# Patient Record
Sex: Male | Born: 1985 | Hispanic: No | Marital: Single | Smoking: Former smoker
Health system: Southern US, Community
[De-identification: ages and names within clinical notes are randomized; demographics above are authoritative.]

## PROBLEM LIST (undated history)

## (undated) DIAGNOSIS — B1920 Unspecified viral hepatitis C without hepatic coma: Secondary | ICD-10-CM

---

## 2009-08-23 DIAGNOSIS — B1920 Unspecified viral hepatitis C without hepatic coma: Secondary | ICD-10-CM

## 2009-08-23 HISTORY — DX: Unspecified viral hepatitis C without hepatic coma: B19.20

## 2014-10-03 ENCOUNTER — Encounter (HOSPITAL_COMMUNITY): Admission: EM | Payer: Self-pay | Source: Home / Self Care

## 2014-10-03 ENCOUNTER — Inpatient Hospital Stay (HOSPITAL_COMMUNITY)
Admission: EM | Admit: 2014-10-03 | Discharge: 2014-10-10 | DRG: 327 | Disposition: A | Discharge status: Home / Self Care | Payer: Medicaid Other | Attending: General Surgery | Admitting: General Surgery

## 2014-10-03 ENCOUNTER — Emergency Department (HOSPITAL_COMMUNITY): Payer: Medicaid Other | Admitting: Certified Registered"

## 2014-10-03 ENCOUNTER — Emergency Department (HOSPITAL_COMMUNITY): Payer: Medicaid Other

## 2014-10-03 ENCOUNTER — Encounter (HOSPITAL_COMMUNITY): Payer: Self-pay | Admitting: Radiology

## 2014-10-03 DIAGNOSIS — S3510XA Unspecified injury of inferior vena cava, initial encounter: Secondary | ICD-10-CM

## 2014-10-03 DIAGNOSIS — K660 Peritoneal adhesions (postprocedural) (postinfection): Secondary | ICD-10-CM | POA: Diagnosis present

## 2014-10-03 DIAGNOSIS — S37001A Unspecified injury of right kidney, initial encounter: Secondary | ICD-10-CM

## 2014-10-03 DIAGNOSIS — Z9889 Other specified postprocedural states: Secondary | ICD-10-CM

## 2014-10-03 DIAGNOSIS — S36533A Laceration of sigmoid colon, initial encounter: Secondary | ICD-10-CM | POA: Diagnosis present

## 2014-10-03 DIAGNOSIS — D62 Acute posthemorrhagic anemia: Secondary | ICD-10-CM | POA: Diagnosis not present

## 2014-10-03 DIAGNOSIS — X788XXA Intentional self-harm by other sharp object, initial encounter: Secondary | ICD-10-CM | POA: Diagnosis present

## 2014-10-03 DIAGNOSIS — T191XXA Foreign body in bladder, initial encounter: Secondary | ICD-10-CM | POA: Diagnosis present

## 2014-10-03 DIAGNOSIS — S31119A Laceration without foreign body of abdominal wall, unspecified quadrant without penetration into peritoneal cavity, initial encounter: Secondary | ICD-10-CM

## 2014-10-03 DIAGNOSIS — T189XXA Foreign body of alimentary tract, part unspecified, initial encounter: Secondary | ICD-10-CM

## 2014-10-03 DIAGNOSIS — T183XXA Foreign body in small intestine, initial encounter: Principal | ICD-10-CM | POA: Diagnosis present

## 2014-10-03 DIAGNOSIS — Y92149 Unspecified place in prison as the place of occurrence of the external cause: Secondary | ICD-10-CM

## 2014-10-03 DIAGNOSIS — S3630XA Unspecified injury of stomach, initial encounter: Secondary | ICD-10-CM | POA: Diagnosis present

## 2014-10-03 DIAGNOSIS — S37009A Unspecified injury of unspecified kidney, initial encounter: Secondary | ICD-10-CM | POA: Diagnosis present

## 2014-10-03 DIAGNOSIS — T1490XA Injury, unspecified, initial encounter: Secondary | ICD-10-CM

## 2014-10-03 DIAGNOSIS — S31129A Laceration of abdominal wall with foreign body, unspecified quadrant without penetration into peritoneal cavity, initial encounter: Secondary | ICD-10-CM | POA: Diagnosis present

## 2014-10-03 DIAGNOSIS — T184XXA Foreign body in colon, initial encounter: Secondary | ICD-10-CM | POA: Diagnosis present

## 2014-10-03 DIAGNOSIS — S36503A Unspecified injury of sigmoid colon, initial encounter: Secondary | ICD-10-CM

## 2014-10-03 DIAGNOSIS — K631 Perforation of intestine (nontraumatic): Secondary | ICD-10-CM

## 2014-10-03 DIAGNOSIS — T182XXA Foreign body in stomach, initial encounter: Secondary | ICD-10-CM | POA: Diagnosis present

## 2014-10-03 DIAGNOSIS — S3720XA Unspecified injury of bladder, initial encounter: Secondary | ICD-10-CM | POA: Diagnosis present

## 2014-10-03 DIAGNOSIS — S36498A Other injury of other part of small intestine, initial encounter: Secondary | ICD-10-CM | POA: Diagnosis present

## 2014-10-03 HISTORY — DX: Unspecified viral hepatitis C without hepatic coma: B19.20

## 2014-10-03 HISTORY — PX: LAPAROTOMY: SHX154

## 2014-10-03 LAB — COMPREHENSIVE METABOLIC PANEL
ALT: 43 U/L (ref 0–53)
AST: 29 U/L (ref 0–37)
Albumin: 3.7 g/dL (ref 3.5–5.2)
Alkaline Phosphatase: 103 U/L (ref 39–117)
Anion gap: 8 (ref 5–15)
BUN: 10 mg/dL (ref 6–23)
CALCIUM: 9.3 mg/dL (ref 8.4–10.5)
CO2: 28 mmol/L (ref 19–32)
CREATININE: 0.74 mg/dL (ref 0.50–1.35)
Chloride: 99 mmol/L (ref 96–112)
GFR calc Af Amer: >90 mL/min (ref >90)
GFR calc non Af Amer: >90 mL/min (ref >90)
GLUCOSE: 110 mg/dL — AB (ref 70–99)
Potassium: 4.3 mmol/L (ref 3.5–5.1)
Sodium: 135 mmol/L (ref 135–145)
Total Bilirubin: 0.4 mg/dL (ref 0.3–1.2)
Total Protein: 7.9 g/dL (ref 6.0–8.3)

## 2014-10-03 LAB — URINE MICROSCOPIC-ADD ON

## 2014-10-03 LAB — I-STAT CG4 LACTIC ACID, ED: LACTIC ACID, VENOUS: 2.08 mmol/L — AB (ref 0.5–2.0)

## 2014-10-03 LAB — URINALYSIS, ROUTINE W REFLEX MICROSCOPIC
Bilirubin Urine: NEGATIVE
Glucose, UA: NEGATIVE mg/dL
Hgb urine dipstick: NEGATIVE
Ketones, ur: NEGATIVE mg/dL
Nitrite: NEGATIVE
PH: 6.5 (ref 5.0–8.0)
Protein, ur: NEGATIVE mg/dL
SPECIFIC GRAVITY, URINE: 1.028 (ref 1.005–1.030)
Urobilinogen, UA: 0.2 mg/dL (ref 0.0–1.0)

## 2014-10-03 LAB — I-STAT CHEM 8, ED
BUN: 12 mg/dL (ref 6–23)
CALCIUM ION: 1.15 mmol/L (ref 1.12–1.23)
CHLORIDE: 100 mmol/L (ref 96–112)
Creatinine, Ser: 0.7 mg/dL (ref 0.50–1.35)
Glucose, Bld: 105 mg/dL — ABNORMAL HIGH (ref 70–99)
HCT: 49 % (ref 39.0–52.0)
HEMOGLOBIN: 16.7 g/dL (ref 13.0–17.0)
Potassium: 4.4 mmol/L (ref 3.5–5.1)
Sodium: 140 mmol/L (ref 135–145)
TCO2: 24 mmol/L (ref 0–100)

## 2014-10-03 LAB — CBC
HCT: 40.4 % (ref 39.0–52.0)
Hemoglobin: 13.8 g/dL (ref 13.0–17.0)
MCH: 28.8 pg (ref 26.0–34.0)
MCHC: 34.2 g/dL (ref 30.0–36.0)
MCV: 84.2 fL (ref 78.0–100.0)
Platelets: 277 10*3/uL (ref 150–400)
RBC: 4.8 MIL/uL (ref 4.22–5.81)
RDW: 12.6 % (ref 11.5–15.5)
WBC: 8.4 10*3/uL (ref 4.0–10.5)

## 2014-10-03 LAB — PROTIME-INR
INR: 0.95 (ref 0.00–1.49)
PROTHROMBIN TIME: 12.8 s (ref 11.6–15.2)

## 2014-10-03 LAB — ABO/RH: ABO/RH(D): A POS

## 2014-10-03 LAB — CDS SEROLOGY

## 2014-10-03 LAB — PREPARE RBC (CROSSMATCH)

## 2014-10-03 LAB — ETHANOL: Alcohol, Ethyl (B): <5 mg/dL (ref 0–9)

## 2014-10-03 LAB — I-STAT TROPONIN, ED: Troponin i, poc: 0 ng/mL (ref 0.00–0.08)

## 2014-10-03 SURGERY — LAPAROTOMY, EXPLORATORY
Anesthesia: General | Site: Abdomen

## 2014-10-03 MED ORDER — ROCURONIUM BROMIDE 50 MG/5ML IV SOLN
INTRAVENOUS | Status: AC
  Filled 2014-10-03: qty 1

## 2014-10-03 MED ORDER — NEOSTIGMINE METHYLSULFATE 10 MG/10ML IV SOLN
INTRAVENOUS | Status: AC
  Filled 2014-10-03: qty 1

## 2014-10-03 MED ORDER — ROCURONIUM BROMIDE 100 MG/10ML IV SOLN
INTRAVENOUS | Status: DC | PRN
  Administered 2014-10-03 (×2): 30 mg via INTRAVENOUS
  Administered 2014-10-03 (×2): 20 mg via INTRAVENOUS

## 2014-10-03 MED ORDER — HEMOSTATIC AGENTS (NO CHARGE) OPTIME
TOPICAL | Status: DC | PRN
  Administered 2014-10-03: 1 via TOPICAL

## 2014-10-03 MED ORDER — PROPOFOL 10 MG/ML IV BOLUS
INTRAVENOUS | Status: DC | PRN
Start: 2014-10-03 — End: 2014-10-04
  Administered 2014-10-03: 130 mg via INTRAVENOUS
  Administered 2014-10-03: 70 mg via INTRAVENOUS

## 2014-10-03 MED ORDER — HYDROMORPHONE HCL 1 MG/ML IJ SOLN
INTRAMUSCULAR | Status: AC
  Filled 2014-10-03: qty 1

## 2014-10-03 MED ORDER — MIDAZOLAM HCL 2 MG/2ML IJ SOLN
INTRAMUSCULAR | Status: AC
  Filled 2014-10-03: qty 2

## 2014-10-03 MED ORDER — MIDAZOLAM HCL 5 MG/5ML IJ SOLN
INTRAMUSCULAR | Status: DC | PRN
  Administered 2014-10-03: 2 mg via INTRAVENOUS

## 2014-10-03 MED ORDER — CEFAZOLIN SODIUM-DEXTROSE 2-3 GM-% IV SOLR
INTRAVENOUS | Status: DC | PRN
  Administered 2014-10-03: 2 g via INTRAVENOUS

## 2014-10-03 MED ORDER — SUCCINYLCHOLINE CHLORIDE 20 MG/ML IJ SOLN
INTRAMUSCULAR | Status: DC | PRN
  Administered 2014-10-03: 100 mg via INTRAVENOUS

## 2014-10-03 MED ORDER — HYDROMORPHONE HCL 1 MG/ML IJ SOLN
0.2500 mg | INTRAMUSCULAR | Status: DC | PRN
  Administered 2014-10-04 (×2): 0.5 mg via INTRAVENOUS

## 2014-10-03 MED ORDER — SUFENTANIL CITRATE 50 MCG/ML IV SOLN
INTRAVENOUS | Status: DC | PRN
  Administered 2014-10-03 (×2): 20 ug via INTRAVENOUS
  Administered 2014-10-03: 10 ug via INTRAVENOUS

## 2014-10-03 MED ORDER — SUFENTANIL CITRATE 50 MCG/ML IV SOLN
INTRAVENOUS | Status: AC
  Filled 2014-10-03: qty 1

## 2014-10-03 MED ORDER — CEFAZOLIN SODIUM-DEXTROSE 2-3 GM-% IV SOLR
INTRAVENOUS | Status: AC
  Filled 2014-10-03: qty 50

## 2014-10-03 MED ORDER — IOHEXOL 300 MG/ML  SOLN
100.0000 mL | Freq: Once | INTRAMUSCULAR | Status: AC | PRN
  Administered 2014-10-03: 100 mL via INTRAVENOUS

## 2014-10-03 MED ORDER — 0.9 % SODIUM CHLORIDE (POUR BTL) OPTIME
TOPICAL | Status: DC | PRN
  Administered 2014-10-03: 2000 mL

## 2014-10-03 MED ORDER — NEOSTIGMINE METHYLSULFATE 10 MG/10ML IV SOLN
INTRAVENOUS | Status: DC | PRN
  Administered 2014-10-03: 3 mg via INTRAVENOUS

## 2014-10-03 MED ORDER — SODIUM CHLORIDE 0.9 % IJ SOLN
INTRAMUSCULAR | Status: AC
  Filled 2014-10-03: qty 10

## 2014-10-03 MED ORDER — DEXAMETHASONE SODIUM PHOSPHATE 4 MG/ML IJ SOLN
INTRAMUSCULAR | Status: AC
  Filled 2014-10-03: qty 1

## 2014-10-03 MED ORDER — DEXAMETHASONE SODIUM PHOSPHATE 4 MG/ML IJ SOLN
INTRAMUSCULAR | Status: DC | PRN
  Administered 2014-10-03: 4 mg via INTRAVENOUS

## 2014-10-03 MED ORDER — EPHEDRINE SULFATE 50 MG/ML IJ SOLN
INTRAMUSCULAR | Status: AC
  Filled 2014-10-03: qty 1

## 2014-10-03 MED ORDER — LIDOCAINE HCL (CARDIAC) 20 MG/ML IV SOLN
INTRAVENOUS | Status: DC | PRN
  Administered 2014-10-03: 100 mg via INTRAVENOUS

## 2014-10-03 MED ORDER — ONDANSETRON HCL 4 MG/2ML IJ SOLN
INTRAMUSCULAR | Status: DC | PRN
  Administered 2014-10-03: 4 mg via INTRAVENOUS

## 2014-10-03 MED ORDER — LIDOCAINE HCL (CARDIAC) 20 MG/ML IV SOLN
INTRAVENOUS | Status: AC
  Filled 2014-10-03: qty 5

## 2014-10-03 MED ORDER — LACTATED RINGERS IV SOLN
INTRAVENOUS | Status: DC | PRN
  Administered 2014-10-03 (×3): via INTRAVENOUS

## 2014-10-03 MED ORDER — ONDANSETRON HCL 4 MG/2ML IJ SOLN
INTRAMUSCULAR | Status: AC
  Filled 2014-10-03: qty 2

## 2014-10-03 MED ORDER — GLYCOPYRROLATE 0.2 MG/ML IJ SOLN
INTRAMUSCULAR | Status: AC
  Filled 2014-10-03: qty 2

## 2014-10-03 MED ORDER — EPHEDRINE SULFATE 50 MG/ML IJ SOLN
INTRAMUSCULAR | Status: DC | PRN
  Administered 2014-10-03 (×2): 10 mg via INTRAVENOUS

## 2014-10-03 MED ORDER — GLYCOPYRROLATE 0.2 MG/ML IJ SOLN
INTRAMUSCULAR | Status: DC | PRN
  Administered 2014-10-03: 0.4 mg via INTRAVENOUS

## 2014-10-03 MED ORDER — SUCCINYLCHOLINE CHLORIDE 20 MG/ML IJ SOLN
INTRAMUSCULAR | Status: AC
  Filled 2014-10-03: qty 1

## 2014-10-03 SURGICAL SUPPLY — 71 items
BLADE SURG ROTATE 9660 (MISCELLANEOUS) IMPLANT
BNDG GAUZE ELAST 4 BULKY (GAUZE/BANDAGES/DRESSINGS) ×3 IMPLANT
CANISTER SUCTION 2500CC (MISCELLANEOUS) ×3 IMPLANT
CHLORAPREP W/TINT 26ML (MISCELLANEOUS) ×3 IMPLANT
CLIP TI MEDIUM 6 (CLIP) ×3 IMPLANT
COVER MAYO STAND STRL (DRAPES) ×6 IMPLANT
COVER SURGICAL LIGHT HANDLE (MISCELLANEOUS) ×3 IMPLANT
DRAIN CHANNEL 19F RND (DRAIN) ×6 IMPLANT
DRAPE C-ARM 42X72 X-RAY (DRAPES) ×3 IMPLANT
DRAPE LAPAROSCOPIC ABDOMINAL (DRAPES) ×3 IMPLANT
DRAPE PROXIMA HALF (DRAPES) IMPLANT
DRAPE UTILITY XL STRL (DRAPES) ×6 IMPLANT
DRAPE WARM FLUID 44X44 (DRAPE) ×3 IMPLANT
DRSG OPSITE POSTOP 4X10 (GAUZE/BANDAGES/DRESSINGS) IMPLANT
DRSG OPSITE POSTOP 4X8 (GAUZE/BANDAGES/DRESSINGS) IMPLANT
DRSG PAD ABDOMINAL 8X10 ST (GAUZE/BANDAGES/DRESSINGS) ×6 IMPLANT
ELECT BLADE 6.5 EXT (BLADE) ×3 IMPLANT
ELECT CAUTERY BLADE 6.4 (BLADE) ×3 IMPLANT
ELECT REM PT RETURN 9FT ADLT (ELECTROSURGICAL) ×3
ELECTRODE REM PT RTRN 9FT ADLT (ELECTROSURGICAL) ×1 IMPLANT
EVACUATOR SILICONE 100CC (DRAIN) ×6 IMPLANT
GAUZE SPONGE 4X4 12PLY STRL (GAUZE/BANDAGES/DRESSINGS) ×3 IMPLANT
GAUZE SPONGE 4X4 16PLY XRAY LF (GAUZE/BANDAGES/DRESSINGS) ×3 IMPLANT
GLOVE BIO SURGEON STRL SZ7 (GLOVE) ×3 IMPLANT
GLOVE BIOGEL M STRL SZ7.5 (GLOVE) ×3 IMPLANT
GLOVE BIOGEL PI IND STRL 6.5 (GLOVE) ×1 IMPLANT
GLOVE BIOGEL PI IND STRL 7.5 (GLOVE) ×2 IMPLANT
GLOVE BIOGEL PI IND STRL 8 (GLOVE) ×1 IMPLANT
GLOVE BIOGEL PI INDICATOR 6.5 (GLOVE) ×2
GLOVE BIOGEL PI INDICATOR 7.5 (GLOVE) ×4
GLOVE BIOGEL PI INDICATOR 8 (GLOVE) ×2
GLOVE ECLIPSE 6.5 STRL STRAW (GLOVE) ×3 IMPLANT
GLOVE SURG SS PI 7.5 STRL IVOR (GLOVE) ×3 IMPLANT
GOWN STRL REUS W/ TWL LRG LVL3 (GOWN DISPOSABLE) ×3 IMPLANT
GOWN STRL REUS W/TWL LRG LVL3 (GOWN DISPOSABLE) ×6
HEMOSTAT SNOW SURGICEL 2X4 (HEMOSTASIS) ×3 IMPLANT
KIT BASIN OR (CUSTOM PROCEDURE TRAY) ×3 IMPLANT
KIT ROOM TURNOVER OR (KITS) ×3 IMPLANT
LIGASURE IMPACT 36 18CM CVD LR (INSTRUMENTS) ×3 IMPLANT
NS IRRIG 1000ML POUR BTL (IV SOLUTION) ×6 IMPLANT
PACK GENERAL/GYN (CUSTOM PROCEDURE TRAY) ×3 IMPLANT
PAD ARMBOARD 7.5X6 YLW CONV (MISCELLANEOUS) ×3 IMPLANT
PENCIL BUTTON HOLSTER BLD 10FT (ELECTRODE) IMPLANT
RELOAD PROXIMATE 75MM BLUE (ENDOMECHANICALS) ×9 IMPLANT
SPECIMEN JAR LARGE (MISCELLANEOUS) IMPLANT
SPONGE LAP 18X18 X RAY DECT (DISPOSABLE) ×6 IMPLANT
STAPLER GUN LINEAR PROX 60 (STAPLE) ×3 IMPLANT
STAPLER PROXIMATE 75MM BLUE (STAPLE) ×3 IMPLANT
STAPLER VISISTAT 35W (STAPLE) ×3 IMPLANT
SUCTION POOLE TIP (SUCTIONS) ×3 IMPLANT
SUT ETHILON 2 0 FS 18 (SUTURE) ×6 IMPLANT
SUT ETHILON 2 LR (SUTURE) ×15 IMPLANT
SUT NOVA NAB DX-16 0-1 5-0 T12 (SUTURE) ×12 IMPLANT
SUT PDS AB 1 TP1 96 (SUTURE) ×6 IMPLANT
SUT RET BRIDGE (SUTURE) ×6 IMPLANT
SUT SILK 2 0 (SUTURE) ×2
SUT SILK 2 0 SH CR/8 (SUTURE) ×12 IMPLANT
SUT SILK 2-0 18XBRD TIE 12 (SUTURE) ×1 IMPLANT
SUT SILK 3 0 (SUTURE) ×2
SUT SILK 3 0 SH CR/8 (SUTURE) ×6 IMPLANT
SUT SILK 3-0 18XBRD TIE 12 (SUTURE) ×1 IMPLANT
SUT VIC AB 2-0 SH 27 (SUTURE) ×2
SUT VIC AB 2-0 SH 27XBRD (SUTURE) ×1 IMPLANT
SUT VIC AB 3-0 SH 27 (SUTURE)
SUT VIC AB 3-0 SH 27X BRD (SUTURE) IMPLANT
TAPE CLOTH SURG 6X10 WHT LF (GAUZE/BANDAGES/DRESSINGS) ×3 IMPLANT
TOWEL OR 17X26 10 PK STRL BLUE (TOWEL DISPOSABLE) ×3 IMPLANT
TRAY FOLEY CATH 16FRSI W/METER (SET/KITS/TRAYS/PACK) IMPLANT
TUBE CONNECTING 12'X1/4 (SUCTIONS)
TUBE CONNECTING 12X1/4 (SUCTIONS) IMPLANT
YANKAUER SUCT BULB TIP NO VENT (SUCTIONS) IMPLANT

## 2014-10-03 NOTE — Anesthesia Preprocedure Evaluation (Addendum)
Anesthesia Evaluation  Patient identified by MRN, date of birth, ID band Patient awake    Reviewed: Allergy & Precautions, H&P , NPO status , Patient's Chart, lab work & pertinent test results  Airway Mallampati: I  TM Distance: >3 FB Neck ROM: Full    Dental no notable dental hx. (+) Teeth Intact, Dental Advisory Given   Pulmonary neg pulmonary ROS,  breath sounds clear to auscultation  Pulmonary exam normal       Cardiovascular negative cardio ROS  Rhythm:Regular Rate:Normal     Neuro/Psych Depression negative neurological ROS     GI/Hepatic negative GI ROS, Neg liver ROS,   Endo/Other  negative endocrine ROS  Renal/GU negative Renal ROS  negative genitourinary   Musculoskeletal   Abdominal   Peds  Hematology negative hematology ROS (+)   Anesthesia Other Findings   Reproductive/Obstetrics negative OB ROS                            Anesthesia Physical Anesthesia Plan  ASA: II and emergent  Anesthesia Plan: General   Post-op Pain Management:    Induction: Intravenous, Rapid sequence and Cricoid pressure planned  Airway Management Planned: Oral ETT  Additional Equipment:   Intra-op Plan:   Post-operative Plan: Extubation in OR  Informed Consent: I have reviewed the patients History and Physical, chart, labs and discussed the procedure including the risks, benefits and alternatives for the proposed anesthesia with the patient or authorized representative who has indicated his/her understanding and acceptance.   Dental advisory given  Plan Discussed with: CRNA  Anesthesia Plan Comments:         Anesthesia Quick Evaluation

## 2014-10-03 NOTE — ED Notes (Signed)
OR ready for patient

## 2014-10-03 NOTE — ED Notes (Signed)
No neuro or ortho consults made

## 2014-10-03 NOTE — ED Notes (Signed)
Pt returned to room from CT, guards remain at bedside, pt remains in handcuffs, alert and oriented

## 2014-10-03 NOTE — ED Notes (Signed)
Patient transported to CT scanner 2 with Dillard CannonEmily B, RN
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
## 2014-10-03 NOTE — Anesthesia Procedure Notes (Signed)
Procedure Name: Intubation Date/Time: 10/03/2014 8:50 PM Performed by: Melina SchoolsBANKS, Connor Weiss Pre-anesthesia Checklist: Patient identified, Emergency Drugs available, Suction available, Patient being monitored and Timeout performed Patient Re-evaluated:Patient Re-evaluated prior to inductionOxygen Delivery Method: Circle system utilized Preoxygenation: Pre-oxygenation with 100% oxygen Intubation Type: IV induction, Rapid sequence and Cricoid Pressure applied Laryngoscope Size: Mac and 4 Grade View: Grade I Tube type: Oral Tube size: 8.0 mm Number of attempts: 1 Airway Equipment and Method: Stylet Placement Confirmation: ETT inserted through vocal cords under direct vision,  positive ETCO2 and breath sounds checked- equal and bilateral Secured at: 23 cm Tube secured with: Tape Dental Injury: Teeth and Oropharynx as per pre-operative assessment

## 2014-10-03 NOTE — ED Notes (Addendum)
Patient told officers during [facility] that he swallowed a razor blade and had opened his abdominal wound from previous self inflicted laceration with paper clips.   Neither paper clips nor actual razor blades were observed by EMS or police.   Razor is reported to be approx 2 cm in width, 2 inches in length.  Lungs clear and equal bilaterally.  No blood noted in mouth, no bruiosing in neck.  Pain in neck is more severe then pain in abdomen.    EMS vs 140/96, 100 reg.  No IV,s

## 2014-10-03 NOTE — Op Note (Signed)
Preoperative diagnosis: Multiple ingested foreign bodies with multiple perforations Postoperative diagnosis: #1 razor blade and foreign body in the stomach without perforation #2 foreign body traversing the duodenum into the right kidney #3 foreign body traversing posterior to the vena cava into the psoas and paraspinous musculature #4 foreign body protruding from the distal ileum into the bladder #5 foreign body perforation of the distal sigmoid colon Procedure: #1 lyses of adhesions 45 minutes #2 removal of small bowel foreign body with small bowel resection and primary anastomosis #3 repair of bladder injury #4 removal of sigmoid colon foreign body and primary repair of perforation #5 removal of duodenal foreign body with primary repair #6 removal of foreign body in the paraspinous musculature #7 gastrotomy with removal of 2 intragastric foreign bodies #8 incidental appendectomy Surgeon: Dr. Harden MoMatt Raegan Weiss Asst.: Dr. Gaynelle AduEric Wilson Estimated blood loss: 75 mL Drains: #1 19 French Blake drain placed near the duodenal perforation as well as where the foreign body under the right kidney #2 19 [place] Blake drain placed near the bladder repair Complications: None Specimens: #1 small bowel #2 multiple foreign bodies Special count was correct at completion Disposition to stepdown in stable condition  Indications: This a 29 year old male who was very difficult to get a history from. Apparently he has ingested multiple foreign bodies and has had at least had one laparotomy. He also has been involved in opening his wound in there is a question of whether he's eviscerated or not before. At some point before he presented today he has ingested multiple foreign bodies. He is fairly stable with a mildly elevated lactate. His wound has been opened by himself. I discussed with him due to the appearance of the CT scan of multiple perforations going to the operating room with removal of these foreign  bodies.  Procedure: After informed consent was obtained the patient was taken to the operating room. He was given cefazolin. Sequential compression devices were placed on his legs. He was placed under general anesthesia without complication. A nasogastric tube and a Foley catheter were placed. His abdomen was prepped and draped in the standard sterile surgical fashion. Surgical timeout was then performed.  He had already opened the top one quarter of his incision. I proceeded to open the remainder. His abdominal wall was not very healthy. There was really no visible fascia. He just had a large amount of scar tissue. I was able to enter into his peritoneum without any injury. I proceeded to open the remainder of his incision with a combination of a knife and curved Mayo scissors. This was very difficult though. I lysed adhesions for about 45 minutes to be able to just view inside the abdomen. I then went into the pelvis first. I was able to lyse adhesions or bring all the small intestine up. I noted in the ileum a foreign body which was a metal rod of fairly small diameter that was protruding from the small intestine into the bladder. I then removed this foreign body. Eventually I repaired the bladder with 2-0 Vicryl suture. I ended up resecting the small bowel using staplers. I then performed a primary side-to-side functional end-to-end anastomosis using staplers and closed the mesenteric defect with 2-0 silk. This was patent upon completion. A 2-0 silk crotch stitch was placed upon completion. I then removed the foreign body that was protruding from the distal sigmoid colon into the left groin. I repaired this with multiple 2-0 silk sutures. There was no evidence of any contamination of either  of these. I then proceeded to rotate the right colon. I then kocherized the duodenum. I noted in the second portion of the duodenum that there was a metal foreign body protruding and going into the right kidney. There did  not appear to be any significant injury to the kidney or the ureter. The ureter appeared intact throughout its course. I then removed this. I did close the duodenal injury in 2 layers with 3-0 Vicryl and 2-0 silk. I then was able to identify using fluoroscopy the last remaining one was outside of the bowel. This appeared to go between the inferior vena cava and the aorta and traverse the psoas muscle and go into the paraspinous muscles of the right back. This was very difficult to remove. Eventually I was able to identify in the paraspinous muscles of the right back after I rotated the kidney. I then removed this. There was no real bleeding. There was no hematoma centrally either. I then lysed more adhesions and was able to view the stomach. I then made a distal gastrotomy. I removed the metal rod. I was also identify what appeared to be a razor blade and this was removed. I then closed this with 2-0 silk and in addition some Lembert sutures. The nasogastric tube position was confirmed. Hemostasis was observed. I then proceeded to place a drain next to the duodenal repair and the kidney injury. I also placed another drain bladder repair. These were secured with 2-0 nylon suture. I then closed the abdomen with multiple #1 Novafil sutures. I also placed 5 retention bridges as well. I think he is at very high risk for dehiscence given the fact that his abdominal wall was nonexistent and parts. He tolerated this well was extubated and transferred to recovery stable.

## 2014-10-03 NOTE — ED Notes (Signed)
Xray machine not functioning, xray tech to return.

## 2014-10-03 NOTE — ED Notes (Signed)
MD Dwain SarnaWakefield at bedside updating patient, plan to go to OR

## 2014-10-03 NOTE — ED Notes (Signed)
X-ray at bedside

## 2014-10-04 ENCOUNTER — Encounter (HOSPITAL_COMMUNITY): Payer: Self-pay | Admitting: Certified Registered Nurse Anesthetist

## 2014-10-04 DIAGNOSIS — D62 Acute posthemorrhagic anemia: Secondary | ICD-10-CM | POA: Diagnosis not present

## 2014-10-04 DIAGNOSIS — Z9889 Other specified postprocedural states: Secondary | ICD-10-CM

## 2014-10-04 DIAGNOSIS — T184XXA Foreign body in colon, initial encounter: Secondary | ICD-10-CM | POA: Diagnosis present

## 2014-10-04 DIAGNOSIS — S31129A Laceration of abdominal wall with foreign body, unspecified quadrant without penetration into peritoneal cavity, initial encounter: Secondary | ICD-10-CM | POA: Diagnosis present

## 2014-10-04 DIAGNOSIS — X788XXA Intentional self-harm by other sharp object, initial encounter: Secondary | ICD-10-CM | POA: Diagnosis present

## 2014-10-04 DIAGNOSIS — S36498A Other injury of other part of small intestine, initial encounter: Secondary | ICD-10-CM | POA: Diagnosis present

## 2014-10-04 DIAGNOSIS — Y92149 Unspecified place in prison as the place of occurrence of the external cause: Secondary | ICD-10-CM | POA: Diagnosis not present

## 2014-10-04 DIAGNOSIS — T191XXA Foreign body in bladder, initial encounter: Secondary | ICD-10-CM | POA: Diagnosis present

## 2014-10-04 DIAGNOSIS — K631 Perforation of intestine (nontraumatic): Secondary | ICD-10-CM | POA: Diagnosis present

## 2014-10-04 DIAGNOSIS — K660 Peritoneal adhesions (postprocedural) (postinfection): Secondary | ICD-10-CM | POA: Diagnosis present

## 2014-10-04 DIAGNOSIS — S36533A Laceration of sigmoid colon, initial encounter: Secondary | ICD-10-CM | POA: Diagnosis present

## 2014-10-04 DIAGNOSIS — T188XXA Foreign body in other parts of alimentary tract, initial encounter: Secondary | ICD-10-CM | POA: Diagnosis present

## 2014-10-04 DIAGNOSIS — T183XXA Foreign body in small intestine, initial encounter: Secondary | ICD-10-CM | POA: Diagnosis present

## 2014-10-04 DIAGNOSIS — T182XXA Foreign body in stomach, initial encounter: Secondary | ICD-10-CM | POA: Diagnosis present

## 2014-10-04 LAB — CBC
HCT: 39.3 % (ref 39.0–52.0)
Hemoglobin: 13.1 g/dL (ref 13.0–17.0)
MCH: 28.2 pg (ref 26.0–34.0)
MCHC: 33.3 g/dL (ref 30.0–36.0)
MCV: 84.5 fL (ref 78.0–100.0)
Platelets: 252 10*3/uL (ref 150–400)
RBC: 4.65 MIL/uL (ref 4.22–5.81)
RDW: 12.8 % (ref 11.5–15.5)
WBC: 10.6 10*3/uL — ABNORMAL HIGH (ref 4.0–10.5)

## 2014-10-04 LAB — TYPE AND SCREEN
ABO/RH(D): A POS
ANTIBODY SCREEN: NEGATIVE
UNIT DIVISION: 0
UNIT DIVISION: 0
UNIT DIVISION: 0
UNIT DIVISION: 0
Unit division: 0
Unit division: 0
Unit division: 0
Unit division: 0

## 2014-10-04 LAB — BASIC METABOLIC PANEL
Anion gap: 11 (ref 5–15)
BUN: 10 mg/dL (ref 6–23)
CALCIUM: 9 mg/dL (ref 8.4–10.5)
CO2: 23 mmol/L (ref 19–32)
CREATININE: 0.8 mg/dL (ref 0.50–1.35)
Chloride: 102 mmol/L (ref 96–112)
GFR calc non Af Amer: >90 mL/min (ref >90)
Glucose, Bld: 169 mg/dL — ABNORMAL HIGH (ref 70–99)
Potassium: 4.3 mmol/L (ref 3.5–5.1)
Sodium: 136 mmol/L (ref 135–145)

## 2014-10-04 LAB — PREPARE FRESH FROZEN PLASMA
UNIT DIVISION: 0
Unit division: 0

## 2014-10-04 LAB — MRSA PCR SCREENING: MRSA by PCR: NEGATIVE

## 2014-10-04 LAB — BLOOD PRODUCT ORDER (VERBAL) VERIFICATION

## 2014-10-04 MED ORDER — DIPHENHYDRAMINE HCL 50 MG/ML IJ SOLN
12.5000 mg | Freq: Four times a day (QID) | INTRAMUSCULAR | Status: DC | PRN
  Filled 2014-10-04: qty 0.25

## 2014-10-04 MED ORDER — HYDRALAZINE HCL 20 MG/ML IJ SOLN
5.0000 mg | INTRAMUSCULAR | Status: DC | PRN
  Administered 2014-10-04: 5 mg via INTRAVENOUS
  Filled 2014-10-04: qty 1

## 2014-10-04 MED ORDER — ENOXAPARIN SODIUM 40 MG/0.4ML ~~LOC~~ SOLN
40.0000 mg | SUBCUTANEOUS | Status: DC
  Administered 2014-10-04 – 2014-10-09 (×6): 40 mg via SUBCUTANEOUS
  Filled 2014-10-04 (×8): qty 0.4

## 2014-10-04 MED ORDER — SODIUM CHLORIDE 0.9 % IV SOLN
INTRAVENOUS | Status: DC
  Administered 2014-10-04 – 2014-10-08 (×7): via INTRAVENOUS

## 2014-10-04 MED ORDER — NALOXONE HCL 0.4 MG/ML IJ SOLN
0.4000 mg | INTRAMUSCULAR | Status: DC | PRN
  Filled 2014-10-04: qty 1

## 2014-10-04 MED ORDER — ACETAMINOPHEN 650 MG RE SUPP: 650.0000 mg | Freq: Four times a day (QID) | RECTAL | Status: DC | PRN

## 2014-10-04 MED ORDER — ACETAMINOPHEN 325 MG PO TABS
650.0000 mg | ORAL_TABLET | Freq: Four times a day (QID) | ORAL | Status: DC | PRN
  Administered 2014-10-08 – 2014-10-09 (×2): 650 mg via ORAL
  Filled 2014-10-04 (×2): qty 2

## 2014-10-04 MED ORDER — PROMETHAZINE HCL 25 MG/ML IJ SOLN
INTRAMUSCULAR | Status: AC
  Filled 2014-10-04: qty 1

## 2014-10-04 MED ORDER — PROMETHAZINE HCL 25 MG/ML IJ SOLN
6.2500 mg | INTRAMUSCULAR | Status: DC | PRN
Start: 2014-10-04 — End: 2014-10-04
  Administered 2014-10-04: 6.25 mg via INTRAVENOUS

## 2014-10-04 MED ORDER — CARBAMAZEPINE ER 400 MG PO TB12
400.0000 mg | ORAL_TABLET | Freq: Two times a day (BID) | ORAL | Status: DC
  Filled 2014-10-04 (×3): qty 1

## 2014-10-04 MED ORDER — ONDANSETRON HCL 4 MG/2ML IJ SOLN
4.0000 mg | Freq: Four times a day (QID) | INTRAMUSCULAR | Status: DC | PRN
  Administered 2014-10-04 – 2014-10-09 (×7): 4 mg via INTRAVENOUS
  Filled 2014-10-04 (×5): qty 2

## 2014-10-04 MED ORDER — HYDROMORPHONE 0.3 MG/ML IV SOLN
INTRAVENOUS | Status: AC
  Filled 2014-10-04: qty 25

## 2014-10-04 MED ORDER — CARBAMAZEPINE 100 MG/5ML PO SUSP
200.0000 mg | Freq: Four times a day (QID) | ORAL | Status: DC
  Administered 2014-10-04 – 2014-10-05 (×5): 200 mg
  Filled 2014-10-04 (×9): qty 10

## 2014-10-04 MED ORDER — HYDROMORPHONE 0.3 MG/ML IV SOLN
INTRAVENOUS | Status: DC
  Administered 2014-10-04 (×3): via INTRAVENOUS
  Administered 2014-10-04: 6.72 mg via INTRAVENOUS
  Administered 2014-10-04: 3.48 mg via INTRAVENOUS
  Administered 2014-10-04: 9.6 mg via INTRAVENOUS
  Administered 2014-10-04: 2.99 mg via INTRAVENOUS
  Administered 2014-10-04: 3.6 mg via INTRAVENOUS
  Administered 2014-10-05: 2.1 mg via INTRAVENOUS
  Administered 2014-10-05: 4.2 mg via INTRAVENOUS
  Administered 2014-10-05: 3.6 mg via INTRAVENOUS
  Administered 2014-10-05: 0.9 mg via INTRAVENOUS
  Administered 2014-10-05: 2.4 mg via INTRAVENOUS
  Administered 2014-10-06 (×2): 3.3 mg via INTRAVENOUS
  Administered 2014-10-06: 2.7 mg via INTRAVENOUS
  Administered 2014-10-06: 22:00:00 via INTRAVENOUS
  Administered 2014-10-06: 4.1 mg via INTRAVENOUS
  Administered 2014-10-06: 2.3 mg via INTRAVENOUS
  Administered 2014-10-06: 1.7 mg via INTRAVENOUS
  Administered 2014-10-06: 1.8 mg via INTRAVENOUS
  Administered 2014-10-07: 0.3 mg via INTRAVENOUS
  Administered 2014-10-07 (×2): via INTRAVENOUS
  Administered 2014-10-07: 7.2 mg via INTRAVENOUS
  Administered 2014-10-07: 3 mg via INTRAVENOUS
  Administered 2014-10-08: 0.9 mg via INTRAVENOUS
  Administered 2014-10-08: 1.69 mg via INTRAVENOUS
  Administered 2014-10-08: 3.6 mg via INTRAVENOUS
  Filled 2014-10-04 (×11): qty 25

## 2014-10-04 MED ORDER — HYDROMORPHONE HCL 1 MG/ML IJ SOLN
1.0000 mg | Freq: Two times a day (BID) | INTRAMUSCULAR | Status: DC | PRN
  Administered 2014-10-05: 1 mg via INTRAVENOUS
  Filled 2014-10-04: qty 1

## 2014-10-04 MED ORDER — PANTOPRAZOLE SODIUM 40 MG IV SOLR
40.0000 mg | Freq: Every day | INTRAVENOUS | Status: DC
  Administered 2014-10-04 – 2014-10-08 (×6): 40 mg via INTRAVENOUS
  Filled 2014-10-04 (×6): qty 40

## 2014-10-04 MED ORDER — DIPHENHYDRAMINE HCL 12.5 MG/5ML PO ELIX
12.5000 mg | ORAL_SOLUTION | Freq: Four times a day (QID) | ORAL | Status: DC | PRN
  Filled 2014-10-04: qty 5

## 2014-10-04 MED ORDER — SERTRALINE HCL 100 MG PO TABS
200.0000 mg | ORAL_TABLET | Freq: Every day | ORAL | Status: DC
  Administered 2014-10-04 – 2014-10-10 (×7): 200 mg via ORAL
  Filled 2014-10-04 (×7): qty 2

## 2014-10-04 MED ORDER — CETYLPYRIDINIUM CHLORIDE 0.05 % MT LIQD
7.0000 mL | Freq: Two times a day (BID) | OROMUCOSAL | Status: DC
  Administered 2014-10-04 – 2014-10-10 (×10): 7 mL via OROMUCOSAL

## 2014-10-04 MED ORDER — ONDANSETRON HCL 4 MG/2ML IJ SOLN
4.0000 mg | Freq: Four times a day (QID) | INTRAMUSCULAR | Status: DC | PRN
  Administered 2014-10-04: 4 mg via INTRAVENOUS
  Filled 2014-10-04 (×5): qty 2

## 2014-10-04 MED ORDER — SODIUM CHLORIDE 0.9 % IJ SOLN: 9.0000 mL | INTRAMUSCULAR | Status: DC | PRN

## 2014-10-04 NOTE — Progress Notes (Signed)
Abd. Dressing changed, wet-to-dry.  Site clean, no necrosis or odor, no purulent or odorous drainage; small amnt serosang. drainage in wound cavity.  Retention sutures intact, both JP drains secure.  Tol'd well by pt.

## 2014-10-04 NOTE — Progress Notes (Signed)
Received message from RN on unit about [facility].  I called [facility] at [phone].  They can't accept patient in his current condition.  The RN there stated patient would have to return [facility].   [facility]. That number was [phone]. The RN that answered stated that they were very familiar with this patient.  They did not currently have an accepting MD or bed available. She took the pager number for the Trauma MD [phone] and is going to have her MD call [facility] to work out when the transfer should take place.   Carlyle LipaMichelle Damare Serano, RN BSN MHA CCM  Case Manager, Trauma Service/Unit 24M [phone]

## 2014-10-04 NOTE — Anesthesia Postprocedure Evaluation (Signed)
  Anesthesia Post-op Note  Patient: Connor BeachJoshua Weiss  Procedure(s) Performed: Procedure(s): EXPLORATORY LAPAROTOMY, Foreign body removal times five, Small bowel resection, Repair of rectal injury, Repair of bladder injury, Gastrotomy, Lysis of adhesions, Appendectomy, Repair of duodenal injury (N/A)  Patient Location: PACU  Anesthesia Type:General  Level of Consciousness: awake and alert   Airway and Oxygen Therapy: Patient Spontanous Breathing  Post-op Pain: moderate  Post-op Assessment: Post-op Vital signs reviewed, Patient's Cardiovascular Status Stable and Respiratory Function Stable  Post-op Vital Signs: Reviewed  Filed Vitals:   10/04/14 0807  BP:   Pulse:   Temp: 36.9 C  Resp:     Complications: No apparent anesthesia complications
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
## 2014-10-04 NOTE — Progress Notes (Signed)
Pt arrived to 3S08, pt is accompanied by 2 armed [employer] officers and is in their handcuffs.

## 2014-10-04 NOTE — Progress Notes (Signed)
Patient ID: Connor Weiss, male   DOB: 1986/07/02, 29 y.o.   MRN: [id number]   LOS: 0 days   Subjective: No events overnight. States he swallowed 5 total clips but does not offer chronology when asked.  Localizes pain to incision.     Objective: Vital signs in last 24 hours: Temp:  [97.3 F (36.3 C)-98.8 F (37.1 C)] 98.7 F (37.1 C) (02/12 0341) Pulse Rate:  [87-113] 89 (02/12 0341) Resp:  [9-18] 13 (02/12 0720) BP: (140-170)/(72-99) 170/97 mmHg (02/12 0341) SpO2:  [98 %-100 %] 100 % (02/12 0720) Weight:  [150 lb (68.04 kg)-150 lb 12.7 oz (68.4 kg)] 150 lb 12.7 oz (68.4 kg) (02/12 0117)     Laboratory  CBC  Recent Labs  10/03/14 1908 10/03/14 2001 10/04/14 0315  WBC 8.4  --  10.6*  HGB 13.8 16.7 13.1  HCT 40.4 49.0 39.3  PLT 277  --  252   BMET  Recent Labs  10/03/14 1908 10/03/14 2001 10/04/14 0315  NA 135 140 136  K 4.3 4.4 4.3  CL 99 100 102  CO2 28  --  23  GLUCOSE 110* 105* 169*  BUN 10 12 10   CREATININE 0.74 0.70 0.80  CALCIUM 9.3  --  9.0     Physical Exam General appearance: alert and no distress Resp: clear to auscultation bilaterally Cardio: regular rate and rhythm GI: soft, non distended, hypoactive bowels, incision clean without drainage Pulses: 2+ and symmetric   Assessment/Plan: Multiple ingested foreign bodies Small bowel resection w primary anastomosis Primary repair of duodenal and sigmoid colon injury blake drain near site of repair with minimal output Bladder injury - foley in place; Blake drain near site of repair with minimal output Gastrotomy - NG tube for now FEN - npo VTE - lovenox, SCDs Dispo - Keep in SDU for now.  Flush NGT q shift with 20cc saline. No other changes.  Emilie RutterMatthew Eveland PA-S 10/04/2014

## 2014-10-04 NOTE — Progress Notes (Signed)
BP remains elevated, pt. denies hx of HTN, denies HA/visual changes or any other sxs.  Order received for hydralazine, given with VS as charted; no evidence of adverse effects. Will cont. to monitor.

## 2014-10-04 NOTE — Transfer of Care (Signed)
Immediate Anesthesia Transfer of Care Note  Patient: Connor Weiss  Procedure(s) Performed: Procedure(s): EXPLORATORY LAPAROTOMY, Foreign body removal times five, Small bowel resection, Repair of rectal injury, Repair of bladder injury, Gastrotomy, Lysis of adhesions, Appendectomy, Repair of duodenal injury (N/A)  Patient Location: PACU  Anesthesia Type:General  Level of Consciousness: oriented, sedated, patient cooperative and responds to stimulation  Airway & Oxygen Therapy: Patient Spontanous Breathing and Patient connected to nasal cannula oxygen  Post-op Assessment: Report given to RN, Post -op Vital signs reviewed and stable and Patient moving all extremities X 4  Post vital signs: Reviewed and stable  Last Vitals:  Filed Vitals:   10/04/14 0015  BP: 143/79  Pulse: 91  Temp: 36.3 C  Resp: 15    Complications: No apparent anesthesia complications

## 2014-10-05 MED ORDER — CARBAMAZEPINE 200 MG PO TABS
200.0000 mg | ORAL_TABLET | Freq: Four times a day (QID) | ORAL | Status: DC
  Administered 2014-10-05 – 2014-10-10 (×19): 200 mg via ORAL
  Filled 2014-10-05 (×4): qty 1
  Filled 2014-10-05: qty 2
  Filled 2014-10-05 (×9): qty 1
  Filled 2014-10-05: qty 2
  Filled 2014-10-05 (×4): qty 1

## 2014-10-05 NOTE — Progress Notes (Signed)
Pt transferred to 6N03 per MD order. Report called to receiving nurse and all questions answered.

## 2014-10-05 NOTE — Progress Notes (Signed)
Trauma Service Note  Subjective: Patient is more conversant and cooperative today.  No acute distress  Objective: Vital signs in last 24 hours: Temp:  [98.3 F (36.8 C)-99.5 F (37.5 C)] 99.5 F (37.5 C) (02/13 0345) Pulse Rate:  [87-114] 110 (02/13 0345) Resp:  [10-20] 12 (02/13 0351) BP: (125-152)/(80-103) 139/87 mmHg (02/13 0345) SpO2:  [92 %-100 %] 92 % (02/13 0351) Last BM Date: 10/02/14  Intake/Output from previous day: 02/12 0701 - 02/13 0700 In: 2600 [I.V.:2400; NG/GT:200] Out: 1692.5 [Urine:1400; Emesis/NG output:210; Drains:82.5] Intake/Output this shift:    General: No acute distress  Lungs: Clear  Abd: Mildly distended, but excellent bowel sounds and minimal NGT output.  Extremities: No problems, no clinical signs or symptoms of DVT.  Neuro: Intact.  Lab Results: CBC   Recent Labs  10/03/14 1908 10/03/14 2001 10/04/14 0315  WBC 8.4  --  10.6*  HGB 13.8 16.7 13.1  HCT 40.4 49.0 39.3  PLT 277  --  252   BMET  Recent Labs  10/03/14 1908 10/03/14 2001 10/04/14 0315  NA 135 140 136  K 4.3 4.4 4.3  CL 99 100 102  CO2 28  --  23  GLUCOSE 110* 105* 169*  BUN 10 12 10   CREATININE 0.74 0.70 0.80  CALCIUM 9.3  --  9.0   PT/INR  Recent Labs  10/03/14 1908  LABPROT 12.8  INR 0.95   ABG No results for input(s): PHART, HCO3 in the last 72 hours.  Invalid input(s): PCO2, PO2  Studies/Results: Dg Neck Soft Tissue  10/03/2014   CLINICAL DATA:  Level 2 trauma.  Ingested foreign body.  EXAM: NECK SOFT TISSUES - 1+ VIEW  COMPARISON:  None.  FINDINGS: No radiopaque foreign body.  Regional soft tissues appear normal. Limited visualization of lung apices is normal.  IMPRESSION: No radiopaque foreign body.   Electronically Signed   By: Simonne ComeJohn  Watts M.D.   On: 10/03/2014 19:47   Dg Abd 1 View  10/03/2014   CLINICAL DATA:  Level 2 trauma, swallowed 3 paper clips on 10/02/2014, jammed to paper clips into old abdominal incision from prior surgery, and  swallowed 1 razor blade  EXAM: ABDOMEN - 1 VIEW  COMPARISON:  None.  FINDINGS: Radiopaque foreign body (razor blade) overlies the gastric cardia.  Three metallic linear foreign bodies (unfolded paper clips) overlie the upper abdomen. At least one overlies the distal stomach.  Two metallic linear foreign bodies (unfolded paper clips) overlie the lower abdomen.  IMPRESSION: Radiopaque foreign body (razor blade) overlies the gastric cardia.  Three metallic linear foreign bodies (unfolded paper clips) overlie the upper abdomen.  Two metallic linear foreign bodies (unfolded paper clips) overlie the lower abdomen.   Electronically Signed   By: Charline BillsSriyesh  Krishnan M.D.   On: 10/03/2014 19:48   Ct Soft Tissue Neck W Contrast  10/03/2014   CLINICAL DATA:  Foreign body ingestion of paper clips and razor blades over the last 2 days. History of recent laparotomy for foreign bodies.  EXAM: CT NECK WITH CONTRAST  TECHNIQUE: Multidetector CT imaging of the neck was performed using the standard protocol following the bolus administration of intravenous contrast.  CONTRAST:  100mL OMNIPAQUE IOHEXOL 300 MG/ML  SOLN  COMPARISON:  None.  FINDINGS: Pharynx and larynx: Normal.  Salivary glands: Normal.  Thyroid: Normal.  Lymph nodes: No pathologic lymphadenopathy.  Vascular: Normal.  Limited intracranial: Normal.  Visualized orbits: Normal.  Mastoids and visualized paranasal sinuses: Normal.  Skeleton: Normal.  Upper chest: Normal.  IMPRESSION: Normal CT examination of the neck. No evidence of abnormal gas or fluid collection to suggest perforation.   Electronically Signed   By: Burman Nieves M.D.   On: 10/03/2014 20:28   Ct Chest W Contrast  10/03/2014   CLINICAL DATA:  Patient swallowed 2 paper clips yesterday and 3 paper clips and a razor blade today. Recent history of laparotomy for foreign body.  EXAM: CT CHEST, ABDOMEN, AND PELVIS WITH CONTRAST  TECHNIQUE: Multidetector CT imaging of the chest, abdomen and pelvis was  performed following the standard protocol during bolus administration of intravenous contrast.  CONTRAST:  OMNIPAQUE IOHEXOL 300 MG/ML  SOLN  COMPARISON:  Abdomen 10/03/2014  FINDINGS: CT CHEST FINDINGS  Normal heart size. Normal caliber thoracic aorta. No aortic dissection. Great vessel origins are patent. No abnormal gas or fluid collections in the mediastinum. Esophagus is decompressed. No significant lymphadenopathy in the chest.  Minimal dependent changes in the lung bases. Lungs are otherwise clear and expanded. No focal airspace disease or consolidation. No pleural effusions. No pneumothorax. Airways appear patent.  CT ABDOMEN AND PELVIS FINDINGS  Linear metallic foreign object demonstrated in the upper stomach consistent with ingested razor blade. Linear metallic foreign body in the distal stomach and extending to the pylorus and proximal duodenum consistent with ingested paper clip. Linear metallic foreign body consistent with paper clip beginning in the second portion the duodenum and extending through the paraduodenal soft tissues into the right kidney and exiting the posterior aspect of the right kidney. Linear metallic foreign body consistent with paper clip demonstrated in the retroperitoneum appears to enter the medial aspect of the inferior vena cava inferior to the renal vein origins and extends through vena cava into the iliopsoas muscles posteriorly. Small hematoma in the retroperitoneum adjacent to the IVC. Linear metallic foreign body consistent paper clip demonstrated in the sigmoid colon, perforating anteriorly into the left lower quadrant pelvic fat. Linear foreign body consistent a paper clip demonstrated in the rectum and perforating anteriorly into the bladder.  No evidence of any free air or free fluid in the abdomen or retroperitoneum. Surgical scarring and defect along the midline anterior abdominal wall consistent with history of recent surgery.  The liver, spleen, gallbladder,  pancreas, adrenal glands, abdominal aorta, and and retroperitoneal lymph nodes are otherwise unremarkable. Moderate-sized accessory spleen is present. Stomach and small bowel are decompressed. Colon is diffusely stool-filled. No evidence of bowel distention.  Pelvis: Increased density in the appendix probably representing an appendicolith. Appendix is otherwise normal without evidence of appendicitis. Bladder wall is not thickened. No free or loculated pelvic fluid collections. No pelvic gas collections. Visualized bones appear intact.  IMPRESSION: Multiple metallic foreign bodies demonstrated in the abdomen and pelvis as discussed. Razor blade and paper clip in the upper and mid stomach. Additional paper clip in the duodenum, perforating into the right kidney. Additional paper clip in the retroperitoneum perforating the inferior vena cava and extending into the iliopsoas muscle on the right. Additional paper clip in the sigmoid colon perforating into the anterior left lower quadrant soft tissues. Additional paper clip in the upper rectum perforating into the bladder.  Findings were discussed with the on-call surgeon, Dr. Dwain Sarna, at 2012 hours on 10/03/2014.   Electronically Signed   By: Burman Nieves M.D.   On: 10/03/2014 20:26   Ct Abdomen Pelvis W Contrast  10/03/2014   CLINICAL DATA:  Patient swallowed 2 paper clips yesterday and 3 paper clips and a razor blade today. Recent  history of laparotomy for foreign body.  EXAM: CT CHEST, ABDOMEN, AND PELVIS WITH CONTRAST  TECHNIQUE: Multidetector CT imaging of the chest, abdomen and pelvis was performed following the standard protocol during bolus administration of intravenous contrast.  CONTRAST:  OMNIPAQUE IOHEXOL 300 MG/ML  SOLN  COMPARISON:  Abdomen 10/03/2014  FINDINGS: CT CHEST FINDINGS  Normal heart size. Normal caliber thoracic aorta. No aortic dissection. Great vessel origins are patent. No abnormal gas or fluid collections in the mediastinum.  Esophagus is decompressed. No significant lymphadenopathy in the chest.  Minimal dependent changes in the lung bases. Lungs are otherwise clear and expanded. No focal airspace disease or consolidation. No pleural effusions. No pneumothorax. Airways appear patent.  CT ABDOMEN AND PELVIS FINDINGS  Linear metallic foreign object demonstrated in the upper stomach consistent with ingested razor blade. Linear metallic foreign body in the distal stomach and extending to the pylorus and proximal duodenum consistent with ingested paper clip. Linear metallic foreign body consistent with paper clip beginning in the second portion the duodenum and extending through the paraduodenal soft tissues into the right kidney and exiting the posterior aspect of the right kidney. Linear metallic foreign body consistent with paper clip demonstrated in the retroperitoneum appears to enter the medial aspect of the inferior vena cava inferior to the renal vein origins and extends through vena cava into the iliopsoas muscles posteriorly. Small hematoma in the retroperitoneum adjacent to the IVC. Linear metallic foreign body consistent paper clip demonstrated in the sigmoid colon, perforating anteriorly into the left lower quadrant pelvic fat. Linear foreign body consistent a paper clip demonstrated in the rectum and perforating anteriorly into the bladder.  No evidence of any free air or free fluid in the abdomen or retroperitoneum. Surgical scarring and defect along the midline anterior abdominal wall consistent with history of recent surgery.  The liver, spleen, gallbladder, pancreas, adrenal glands, abdominal aorta, and and retroperitoneal lymph nodes are otherwise unremarkable. Moderate-sized accessory spleen is present. Stomach and small bowel are decompressed. Colon is diffusely stool-filled. No evidence of bowel distention.  Pelvis: Increased density in the appendix probably representing an appendicolith. Appendix is otherwise normal  without evidence of appendicitis. Bladder wall is not thickened. No free or loculated pelvic fluid collections. No pelvic gas collections. Visualized bones appear intact.  IMPRESSION: Multiple metallic foreign bodies demonstrated in the abdomen and pelvis as discussed. Razor blade and paper clip in the upper and mid stomach. Additional paper clip in the duodenum, perforating into the right kidney. Additional paper clip in the retroperitoneum perforating the inferior vena cava and extending into the iliopsoas muscle on the right. Additional paper clip in the sigmoid colon perforating into the anterior left lower quadrant soft tissues. Additional paper clip in the upper rectum perforating into the bladder.  Findings were discussed with the on-call surgeon, Dr. Dwain Sarna, at 2012 hours on 10/03/2014.   Electronically Signed   By: Burman Nieves M.D.   On: 10/03/2014 20:26   Dg Chest Portable 1 View  10/03/2014   CLINICAL DATA:  Level 2 trauma. Swallowed 3 paper clips. Swallowed razor blade. Evaluate foreign body.  EXAM: PORTABLE CHEST - 1 VIEW  COMPARISON:  None.  FINDINGS: Normal cardiac silhouette and mediastinal contours. No focal airspace opacities. No pleural effusion or pneumothorax. No evidence of edema.  No radiopaque foreign body. No acute osseus abnormalities. An IV is seen with the medial aspect of the left arm. Regional soft tissues appear normal.  IMPRESSION: 1.  No acute cardiopulmonary disease.  2. No radiopaque foreign body.   Electronically Signed   By: Simonne Come M.D.   On: 10/03/2014 19:47   Dg C-arm 1-60 Min-no Report  10/03/2014   CLINICAL DATA: foreign body   C-ARM 1-60 MINUTES  Fluoroscopy was utilized by the requesting physician.  No radiographic  interpretation.     Anti-infectives: Anti-infectives    None      Assessment/Plan: s/p Procedure(s): EXPLORATORY LAPAROTOMY, Foreign body removal times five, Small bowel resection, Repair of rectal injury, Repair of bladder injury,  Gastrotomy, Lysis of adhesions, Appendectomy, Repair of duodenal injury Continue foley due to bladder injury Discontinue foley catheter.  Transfer to 6N  LOS: 1 day   Marta Lamas. Gae Bon, MD, FACS [phone] Trauma Surgeon 10/05/2014

## 2014-10-05 NOTE — Progress Notes (Signed)
NG Tube removed per order. Pt. Tolerated well. We will continue to monitor. Raymon MuttonWhitney Davis RN was with me in the room.  Sherrie SportMary Fauna Neuner SN

## 2014-10-06 LAB — CBC WITH DIFFERENTIAL/PLATELET
BASOS PCT: 1 % (ref 0–1)
Basophils Absolute: 0 10*3/uL (ref 0.0–0.1)
EOS PCT: 6 % — AB (ref 0–5)
Eosinophils Absolute: 0.4 10*3/uL (ref 0.0–0.7)
HEMATOCRIT: 30.9 % — AB (ref 39.0–52.0)
HEMOGLOBIN: 10 g/dL — AB (ref 13.0–17.0)
Lymphocytes Relative: 22 % (ref 12–46)
Lymphs Abs: 1.5 10*3/uL (ref 0.7–4.0)
MCH: 27.9 pg (ref 26.0–34.0)
MCHC: 32.4 g/dL (ref 30.0–36.0)
MCV: 86.1 fL (ref 78.0–100.0)
MONO ABS: 0.9 10*3/uL (ref 0.1–1.0)
Monocytes Relative: 13 % — ABNORMAL HIGH (ref 3–12)
Neutro Abs: 3.9 10*3/uL (ref 1.7–7.7)
Neutrophils Relative %: 58 % (ref 43–77)
Platelets: 197 10*3/uL (ref 150–400)
RBC: 3.59 MIL/uL — ABNORMAL LOW (ref 4.22–5.81)
RDW: 13.1 % (ref 11.5–15.5)
WBC: 6.6 10*3/uL (ref 4.0–10.5)

## 2014-10-06 LAB — BASIC METABOLIC PANEL
ANION GAP: 10 (ref 5–15)
BUN: 6 mg/dL (ref 6–23)
CO2: 25 mmol/L (ref 19–32)
Calcium: 8.3 mg/dL — ABNORMAL LOW (ref 8.4–10.5)
Chloride: 101 mmol/L (ref 96–112)
Creatinine, Ser: 0.85 mg/dL (ref 0.50–1.35)
GFR calc Af Amer: >90 mL/min (ref >90)
Glucose, Bld: 67 mg/dL — ABNORMAL LOW (ref 70–99)
Potassium: 3.5 mmol/L (ref 3.5–5.1)
Sodium: 136 mmol/L (ref 135–145)

## 2014-10-06 NOTE — Progress Notes (Signed)
Patient ID: Connor Weiss, male   DOB: 03-Feb-1986, 29 y.o.   MRN: [id number]  LOS: 2 days   Subjective: No flatus yet, intermittent nausea.  VSS.  Afebrile.    Objective: Vital signs in last 24 hours: Temp:  [98.1 F (36.7 C)-99.7 F (37.6 C)] 98.2 F (36.8 C) (02/14 0604) Pulse Rate:  [97-111] 97 (02/14 0604) Resp:  [9-17] 17 (02/14 0604) BP: (132-136)/(78-82) 135/78 mmHg (02/14 0604) SpO2:  [92 %-98 %] 98 % (02/14 0604) Last BM Date: 10/02/14  Lab Results:  CBC  Recent Labs  10/03/14 1908 10/03/14 2001 10/04/14 0315  WBC 8.4  --  10.6*  HGB 13.8 16.7 13.1  HCT 40.4 49.0 39.3  PLT 277  --  252   BMET  Recent Labs  10/03/14 1908 10/03/14 2001 10/04/14 0315  NA 135 140 136  K 4.3 4.4 4.3  CL 99 100 102  CO2 28  --  23  GLUCOSE 110* 105* 169*  BUN 10 12 10   CREATININE 0.74 0.70 0.80  CALCIUM 9.3  --  9.0    Imaging: No results found.   PE: General appearance: alert, cooperative and no distress Resp: clear to auscultation bilaterally Cardio: regular rate and rhythm, S1, S2 normal, no murmur, click, rub or gallop GI: +bs abdomen is soft, appropriately tender.  JP drains with serosanguinous output.  midline wound c/d/i. Extremities: extremities normal, atraumatic, no cyanosis or edema    Patient Active Problem List   Diagnosis Date Noted  . Bowel perforation 10/04/2014  . S/P exploratory laparotomy 10/04/2014     Assessment/Plan: Multiple ingested foreign bodies with multiple perforations POD#3 Small bowel resection w primary anastomosis, foreign body removal, incidental appendectomy, gastrotomy Primary repair of duodenal and sigmoid colon injury Bladder injury - continue foley FEN - npo--has good bs, but no flatus yet, will consider sips.  Will need to transition off PCA VTE - lovenox, SCDs Dispo - continue inpatient, await bowel function    Ashok Norrismina Marl Seago, ANP-BC Pager: [phone] General Trauma PA Pager: [phone]   10/06/2014  7:51  AM

## 2014-10-07 NOTE — Progress Notes (Signed)
Patient ID: Larene BeachJoshua XXXMullins, male   DOB: [birth year], 29 y.o.   MRN: [id number]   LOS: 3 days   POD#3  Subjective: Doing better. Denies flatus, N/V. Tolerating sips.   Objective: Vital signs in last 24 hours: Temp:  [98.1 F (36.7 C)-98.6 F (37 C)] 98.6 F (37 C) (02/15 0539) Pulse Rate:  [86-102] 86 (02/15 0539) Resp:  [10-17] 16 (02/15 0800) BP: (117-138)/(74-84) 117/74 mmHg (02/15 0539) SpO2:  [96 %-99 %] 97 % (02/15 0800) Last BM Date: 10/02/14   Physical Exam General appearance: alert and no distress Resp: clear to auscultation bilaterally Cardio: regular rate and rhythm GI: Soft, +BS, incision clean   Assessment/Plan: Multiple ingested foreign bodies with multiple perforations Small bowel resection w primary anastomosis, foreign body removal, incidental appendectomy, gastrotomy, primary repair of duodenal and sigmoid colon injury Bladder injury - continue foley FEN - Will give clears VTE - lovenox, SCDs Dispo - continue inpatient, await bowel function    Freeman CaldronMichael J. Markon Jares, PA-C Pager: [phone] General Trauma PA Pager: [phone]  10/07/2014

## 2014-10-08 MED ORDER — HYDROMORPHONE HCL 1 MG/ML IJ SOLN
0.5000 mg | INTRAMUSCULAR | Status: DC | PRN
  Administered 2014-10-09: 0.5 mg via INTRAVENOUS
  Filled 2014-10-08: qty 1

## 2014-10-08 MED ORDER — OXYCODONE HCL 5 MG PO TABS
5.0000 mg | ORAL_TABLET | ORAL | Status: DC | PRN
  Administered 2014-10-08 – 2014-10-10 (×8): 15 mg via ORAL
  Filled 2014-10-08 (×8): qty 3

## 2014-10-08 NOTE — Progress Notes (Signed)
Patient ID: Connor Weiss, male   DOB: 04/24/1986, 29 y.o.   MRN: [id number]   LOS: 4 days   POD#4  Subjective: Denies N/V/flatus. Tolerated clears.   Objective: Vital signs in last 24 hours: Temp:  [97.3 F (36.3 C)-98.4 F (36.9 C)] 98.4 F (36.9 C) (02/16 0559) Pulse Rate:  [82-92] 82 (02/16 0559) Resp:  [8-16] 11 (02/16 0746) BP: (118-133)/(75-87) 133/81 mmHg (02/16 0559) SpO2:  [96 %-100 %] 96 % (02/16 0746) Last BM Date: 10/02/14   Right JP: 1825ml/24h Left JP: 9445ml/24h   Physical Exam General appearance: alert and no distress Resp: clear to auscultation bilaterally Cardio: regular rate and rhythm GI: Soft, diminished BS   Assessment/Plan: Multiple ingested foreign bodies with multiple perforations Small bowel resection w primary anastomosis, foreign body removal, incidental appendectomy, gastrotomy, primary repair of duodenal and sigmoid colon injury Bladder injury - continue foley ABL anemia -- Check tomorrow FEN - Will give fulls VTE - Lovenox, SCDs Dispo - continue inpatient, await bowel function    Freeman CaldronMichael J. Shavonna Corella, PA-C Pager: [phone] General Trauma PA Pager: [phone]  10/08/2014

## 2014-10-08 NOTE — Progress Notes (Signed)
UR completed.  [facility] [place] [facility] [place].   Carlyle LipaMichelle Lael Pilch, RN BSN MHA CCM Trauma/Neuro ICU Case Manager [phone]

## 2014-10-08 NOTE — Progress Notes (Signed)
Pt earlier had a temp of 101 at 1847, tab tylenol 650mg  given at 2015 and pt encouraged to use the incentive spirometer, temp rechecked at 2147, read 99.6, Dr  Corliss Skainssuei (on call) paged and notified,said to just continue to monitor pt,was however reassured,call light within pt's reach. Obasogie-Asidi, Taggart Prasad Efe

## 2014-10-09 DIAGNOSIS — S3630XA Unspecified injury of stomach, initial encounter: Secondary | ICD-10-CM | POA: Diagnosis present

## 2014-10-09 DIAGNOSIS — S31119A Laceration without foreign body of abdominal wall, unspecified quadrant without penetration into peritoneal cavity, initial encounter: Secondary | ICD-10-CM | POA: Diagnosis present

## 2014-10-09 DIAGNOSIS — T183XXA Foreign body in small intestine, initial encounter: Secondary | ICD-10-CM | POA: Diagnosis present

## 2014-10-09 DIAGNOSIS — S37009A Unspecified injury of unspecified kidney, initial encounter: Secondary | ICD-10-CM | POA: Diagnosis present

## 2014-10-09 DIAGNOSIS — T184XXA Foreign body in colon, initial encounter: Secondary | ICD-10-CM

## 2014-10-09 DIAGNOSIS — S3720XA Unspecified injury of bladder, initial encounter: Secondary | ICD-10-CM | POA: Diagnosis present

## 2014-10-09 LAB — CBC
HCT: 33 % — ABNORMAL LOW (ref 39.0–52.0)
Hemoglobin: 11.2 g/dL — ABNORMAL LOW (ref 13.0–17.0)
MCH: 28.1 pg (ref 26.0–34.0)
MCHC: 33.9 g/dL (ref 30.0–36.0)
MCV: 82.7 fL (ref 78.0–100.0)
Platelets: 284 10*3/uL (ref 150–400)
RBC: 3.99 MIL/uL — ABNORMAL LOW (ref 4.22–5.81)
RDW: 12.7 % (ref 11.5–15.5)
WBC: 4.7 10*3/uL (ref 4.0–10.5)

## 2014-10-09 MED ORDER — DOCUSATE SODIUM 100 MG PO CAPS
100.0000 mg | ORAL_CAPSULE | Freq: Two times a day (BID) | ORAL | Status: DC
  Administered 2014-10-09 – 2014-10-10 (×3): 100 mg via ORAL
  Filled 2014-10-09 (×3): qty 1

## 2014-10-09 MED ORDER — POLYETHYLENE GLYCOL 3350 17 G PO PACK
17.0000 g | PACK | Freq: Every day | ORAL | Status: DC
  Administered 2014-10-09 – 2014-10-10 (×2): 17 g via ORAL
  Filled 2014-10-09 (×2): qty 1

## 2014-10-09 NOTE — Progress Notes (Signed)
Patient ID: Larene BeachJoshua XXXMullins, male   DOB: 12/20/1985, 29 y.o.   MRN: [id number]   LOS: 5 days   POD#5  Subjective: Denies N/V/flatus. Tolerated fulls yesterday.   Objective: Vital signs in last 24 hours: Temp:  [98.6 F (37 C)-101 F (38.3 C)] 99.2 F (37.3 C) (02/17 0552) Pulse Rate:  [77-93] 77 (02/17 0552) Resp:  [10-16] 12 (02/17 0552) BP: (124-135)/(72-94) 129/84 mmHg (02/17 0552) SpO2:  [94 %-99 %] 97 % (02/17 0552) Last BM Date: 10/02/14   Right JP: 4625ml/24h Left JP: 5010ml/24h   Laboratory  CBC  Recent Labs  10/09/14 0416  WBC 4.7  HGB 11.2*  HCT 33.0*  PLT 284    Physical Exam General appearance: alert and no distress Resp: clear to auscultation bilaterally Cardio: regular rate and rhythm GI: Soft, +BS, mild distension, incision C/D/I   Assessment/Plan: Multiple ingested foreign bodies with multiple perforations Small bowel resection w primary anastomosis, foreign body removal, incidental appendectomy, gastrotomy, primary repair of duodenal and sigmoid colon injury -- D/C left JP Bladder injury - continue foley ABL anemia -- Stable FEN - Will give regular diet, bowel regimen VTE - Lovenox, SCDs Dispo - Likely ok [facility]    Freeman CaldronMichael J. Jossue Rubenstein, PA-C Pager: [phone] General Trauma PA Pager: [phone]  10/09/2014

## 2014-10-10 MED ORDER — DOCUSATE SODIUM 100 MG PO CAPS: 100.0000 mg | ORAL_CAPSULE | Freq: Two times a day (BID) | ORAL | Status: AC

## 2014-10-10 MED ORDER — OXYCODONE HCL 5 MG PO TABS: 5.0000 mg | ORAL_TABLET | ORAL | Status: AC | PRN

## 2014-10-10 NOTE — Discharge Summary (Signed)
Physician Discharge Summary  Patient ID: Connor Weiss XXXMullins MRN: [id number] DOB/AGE: January 30, 1986 29 y.o.  Admit date: 10/03/2014 Discharge date: 10/10/2014  Discharge Diagnoses Patient Active Problem List   Diagnosis Date Noted  . Stomach injury 10/09/2014  . Bladder injury 10/09/2014  . Kidney injury 10/09/2014  . Foreign body of intestine or colon 10/09/2014  . Laceration of abdominal wall 10/09/2014  . Bowel perforation 10/04/2014  . S/P exploratory laparotomy 10/04/2014    Consultants None   Procedures 2/11 -- Lyses of adhesions 45 minutes, removal of small bowel foreign body with small bowel resection and primary anastomosis, repair of bladder injury, removal of sigmoid colon foreign body and primary repair of perforation, removal of duodenal foreign body with primary repair, removal of foreign body in the paraspinous musculature, gastrotomy with removal of 2 intragastric foreign bodies, and incidental appendectomy by Dr. Emelia LoronMatthew Wakefield   HPI: Connor Weiss was a prisoner who stated he swallowed two paper clips the day prior to admission and then some more the next day along with a razor blade. He also tried to open his wound from a prior laparotomy for a foreign body with the aforementioned razor blade.He said he did this because he didn't want to be transferred to a certain facility. He arrived to the ED without any real complaints. CT scans of the neck, chest, abdomen, and pelvis showed multiple foreign bodies throughout the intestines and abdominal cavity. He was taken to the OR for laparotomy.   Hospital Course: Following his procedure he was convalesced on the surgical floor. His nasogastric tube was able to be removed and his diet slowly advanced without incident. His pain was initially controlled with a PCA which was transitioned to orals without incident. He was able to have one of his drains removed prior to discharge. Once he was tolerating a regular diet he was able to be  [facility].  I spoke with Mr. Connor SeltzerBaker, NP, who will be accepting the patient once he arrives and apprised him of the patient's needs. He has retention sutures in place which will need to be removed by a general surgeon when he/she feels appropriate but not before 3/1. His skin wound was left open and is being treated with wet-to-dry dressings changes twice daily. He has a foley catheter for bladder decompression that can be removed on 2/20.     Medication List    TAKE these medications        carbamazepine 400 MG 12 hr tablet  Commonly known as:  TEGRETOL XR  Take 400 mg by mouth 2 (two) times daily.     docusate sodium 100 MG capsule  Commonly known as:  COLACE  Take 1 capsule (100 mg total) by mouth 2 (two) times daily.     oxyCODONE 5 MG immediate release tablet  Commonly known as:  Oxy IR/ROXICODONE  Take 1-3 tablets (5-15 mg total) by mouth every 4 (four) hours as needed (Pain).     sertraline 100 MG tablet  Commonly known as:  ZOLOFT  Take 200 mg by mouth daily.     tetracycline 250 MG capsule  Commonly known as:  ACHROMYCIN,SUMYCIN  Take 250 mg by mouth 2 (two) times daily before a meal. For 10 days starting 04/02/15            Follow-up Information    Follow up with [facility].   Why:  As needed   Contact information:   [address]  [address] [place] [address] [phone]      Discharge planning took greater than 30 minutes.    Signed: Freeman Caldron, PA-C Pager: [phone] General Trauma PA Pager: [phone] 10/10/2014, 8:38 AM

## 2014-10-10 NOTE — Discharge Planning (Signed)
[facility] CM [facility] [place] [facility] and [facility], chart printed per CM and put in envelope. Foley changed to leg bag and pt in paper scrubs per guard. Dc'd per w/c with personal belongings, shoes, to private car accompanied by guards at 1055.

## 2014-10-10 NOTE — Progress Notes (Signed)
Patient ID: Connor Weiss, male   DOB: 03-07-1986, 29 y.o.   MRN: [id number]   LOS: 6 days   Subjective: Tolerated regular diet though he didn't eat much, early satiety. Denies N/V.   Objective: Vital signs in last 24 hours: Temp:  [98.4 F (36.9 C)-100.4 F (38 C)] 99.1 F (37.3 C) (02/18 0541) Pulse Rate:  [79-99] 82 (02/18 0541) Resp:  [14-15] 14 (02/18 0541) BP: (123-141)/(77-88) 123/77 mmHg (02/18 0541) SpO2:  [96 %-100 %] 96 % (02/18 0541) Last BM Date: 10/02/14   JP: 5623ml/24h   Laboratory  CBC  Recent Labs  10/09/14 0416  WBC 4.7  HGB 11.2*  HCT 33.0*  PLT 284    Physical Exam General appearance: alert and no distress Resp: clear to auscultation bilaterally Cardio: regular rate and rhythm GI: Soft, +BS   Assessment/Plan: Multiple ingested foreign bodies with multiple perforations Small bowel resection w primary anastomosis, foreign body removal, incidental appendectomy, gastrotomy, primary repair of duodenal and sigmoid colon injury  Bladder injury - continue foley ABL anemia -- Stable Dispo [facility]    Freeman CaldronMichael J. Felisa Zechman, PA-C Pager: [phone] General Trauma PA Pager: [phone]  10/10/2014

## 2014-10-10 NOTE — Discharge Instructions (Signed)
No lifting more than 5 pounds for 6 weeks.

## 2014-10-10 NOTE — Care Management Note (Signed)
10-10-14 [phone] , she confirmed transfer today by car Alinda SierrasMark Baker NP accepting . Discharge summary faxed to [phone] Attn Mr Excell SeltzerBaker .   Chart printed to be sent with patient .  Guards Educational psychologistand Charge Nurse aware. Ronny FlurryHeather Param Capri RN BSN

## 2015-06-21 IMAGING — CT CT CHEST W/ CM
2 of 5 series · 12 of 36 positions shown, 15 images · IV contrast (Omni 300)
Comparison: Abdomen 10/03/2014

CLINICAL DATA: Patient swallowed 2 paper clips yesterday and 3
paper clips and a razor blade today. Recent history of laparotomy
for foreign body.

EXAM:
CT CHEST, ABDOMEN, AND PELVIS WITH CONTRAST
TECHNIQUE: Multidetector CT imaging of the chest, abdomen and pelvis was
performed following the standard protocol during bolus
administration of intravenous contrast.
CONTRAST:  100mL OMNIPAQUE IOHEXOL 300 MG/ML  SOLN

[Series 3: cap 5.0 i31f 1 · axial · 0.73mm/px · z∈[+391,+976]mm · 9 of 137 slices shown, 12 images]
[im 10/137  mediastinal]
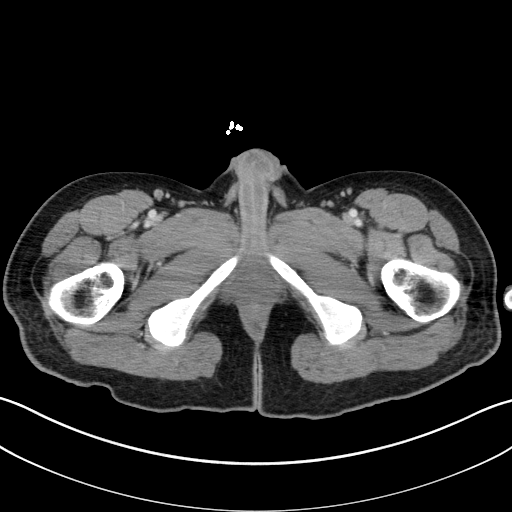
[im 10/137  lung]
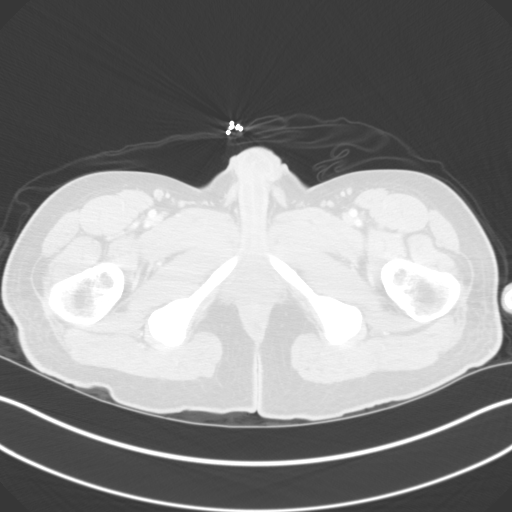
[im 30/137  lung]
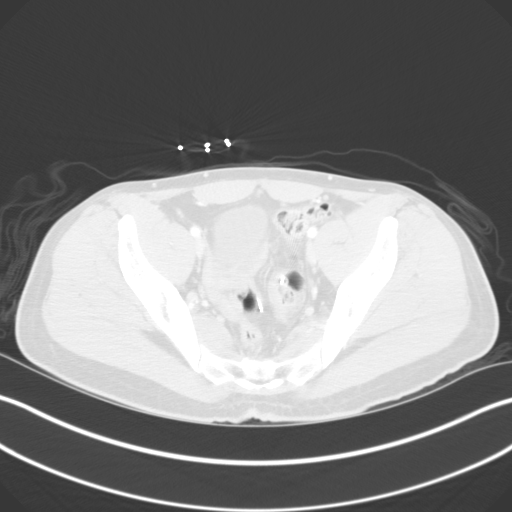
[im 39/137  lung]
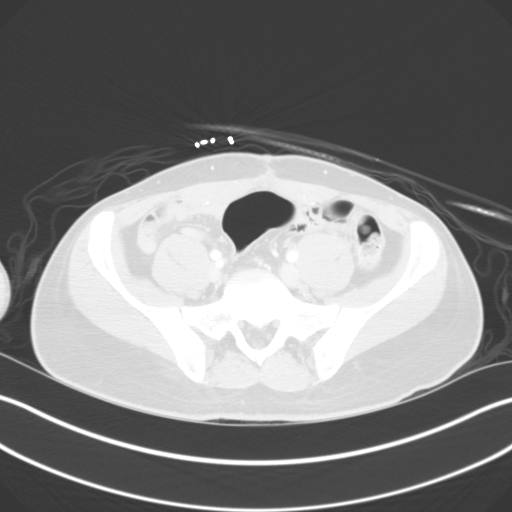
[im 59/137  lung]
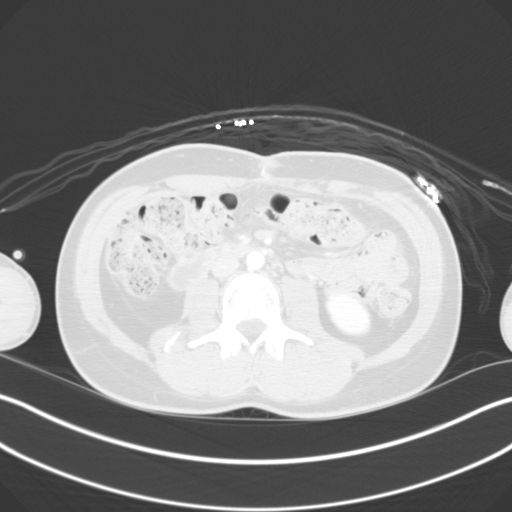
[im 69/137  mediastinal]
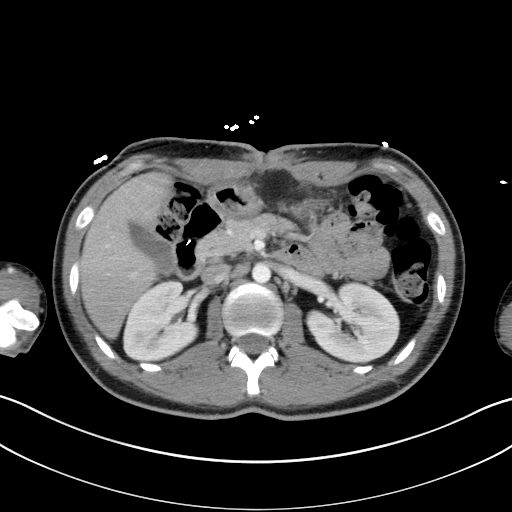
[im 69/137  lung]
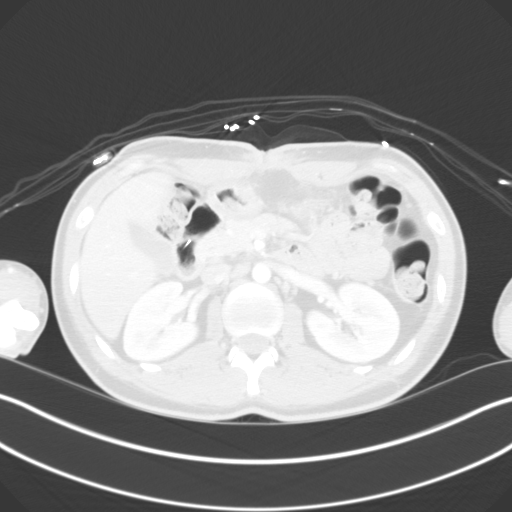
[im 78/137  lung]
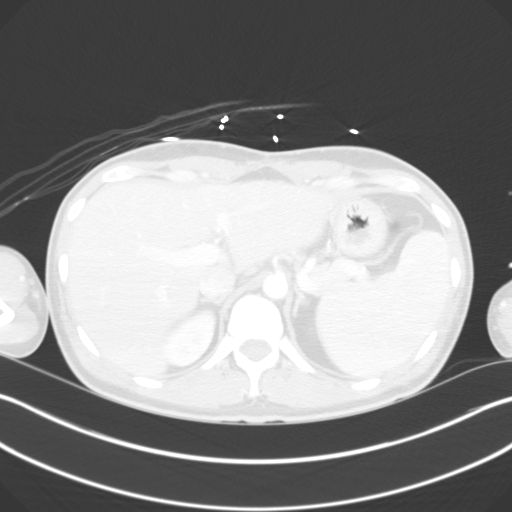
[im 98/137  lung]
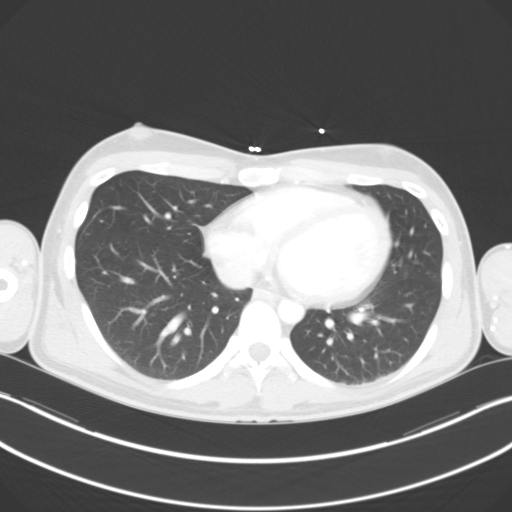
[im 107/137  lung]
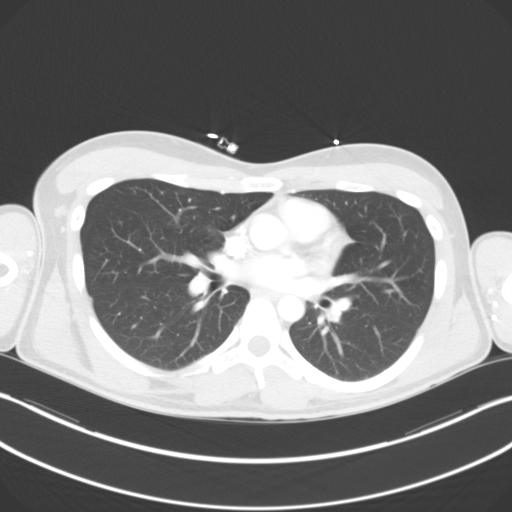
[im 127/137  mediastinal]
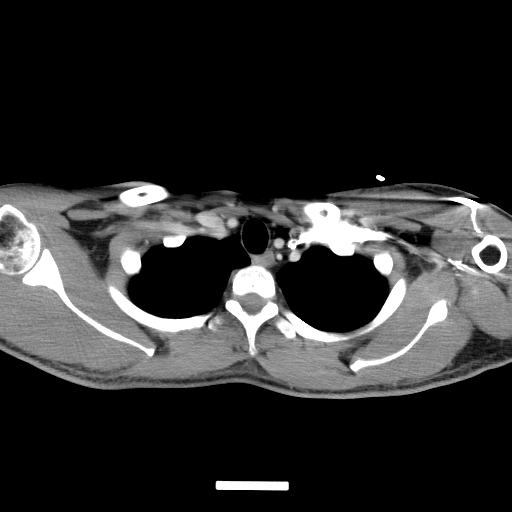
[im 127/137  lung]
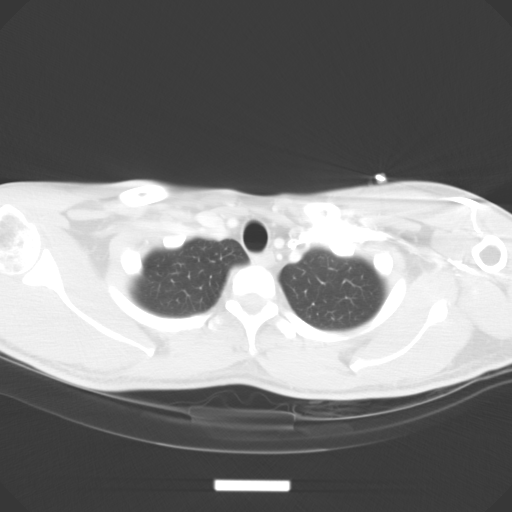

[Series 6: coronal · coronal · 0.75mm/px · 3 of 64 slices shown]
[im 13/64  lung]
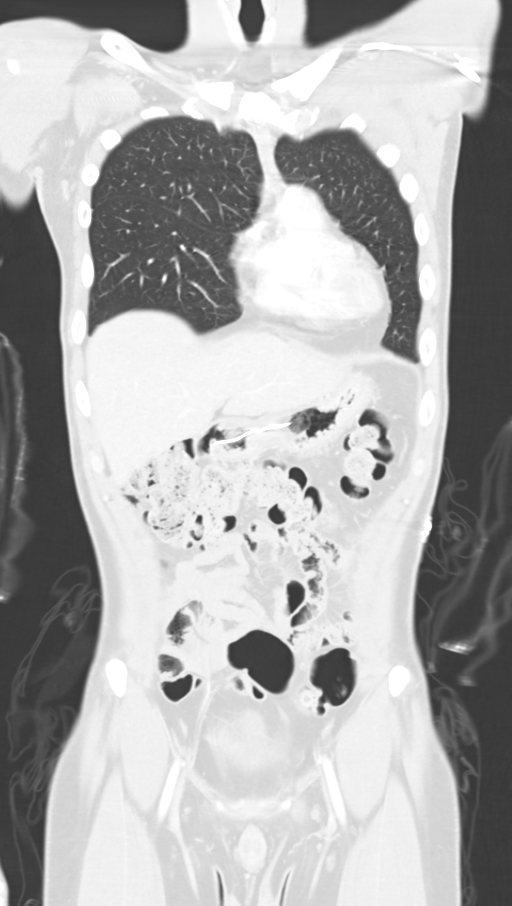
[im 26/64  lung]
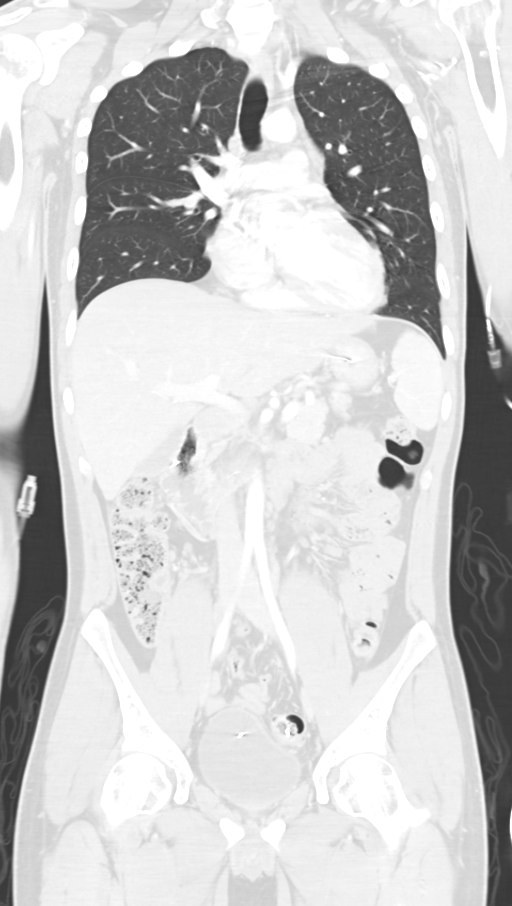
[im 38/64  lung]
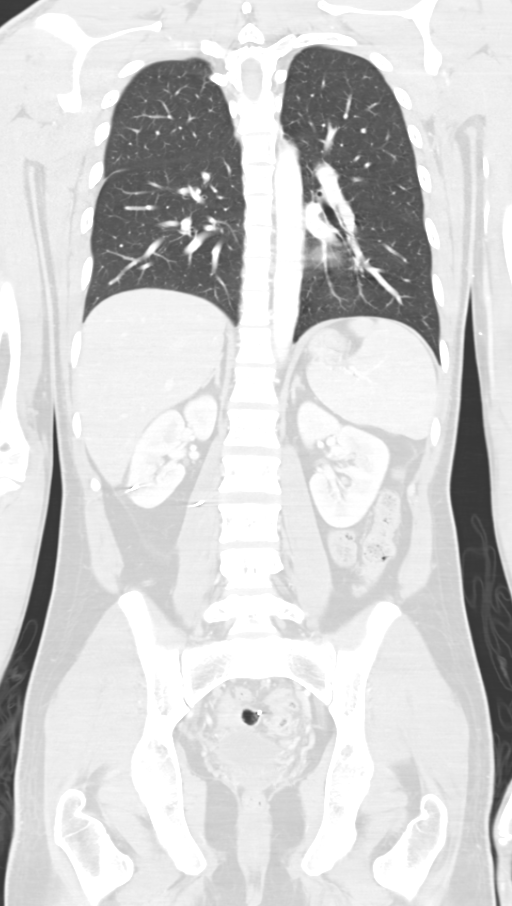

[12 of 36 positions shown; findings below may reference images not displayed]

FINDINGS: CT CHEST FINDINGS

Normal heart size. Normal caliber thoracic aorta. No aortic
dissection. Great vessel origins are patent. No abnormal gas or
fluid collections in the mediastinum. Esophagus is decompressed. No
significant lymphadenopathy in the chest.

Minimal dependent changes in the lung bases. Lungs are otherwise
clear and expanded. No focal airspace disease or consolidation. No
pleural effusions. No pneumothorax. Airways appear patent.

CT ABDOMEN AND PELVIS FINDINGS

Linear metallic foreign object demonstrated in the upper stomach
consistent with ingested razor blade. Linear metallic foreign body
in the distal stomach and extending to the pylorus and proximal
duodenum consistent with ingested paper clip. Linear metallic
foreign body consistent with paper clip beginning in the second
portion the duodenum and extending through the paraduodenal soft
tissues into the right kidney and exiting the posterior aspect of
the right kidney. Linear metallic foreign body consistent with paper
clip demonstrated in the retroperitoneum appears to enter the medial
aspect of the inferior vena cava inferior to the renal vein origins
and extends through vena cava into the iliopsoas muscles
posteriorly. Small hematoma in the retroperitoneum adjacent to the
IVC. Linear metallic foreign body consistent paper clip demonstrated
in the sigmoid colon, perforating anteriorly into the left lower
quadrant pelvic fat. Linear foreign body consistent a paper clip
demonstrated in the rectum and perforating anteriorly into the
bladder.

No evidence of any free air or free fluid in the abdomen or
retroperitoneum. Surgical scarring and defect along the midline
anterior abdominal wall consistent with history of recent surgery.

The liver, spleen, gallbladder, pancreas, adrenal glands, abdominal
aorta, and and retroperitoneal lymph nodes are otherwise
unremarkable. Moderate-sized accessory spleen is present. Stomach
and small bowel are decompressed. Colon is diffusely stool-filled.
No evidence of bowel distention.

Pelvis: Increased density in the appendix probably representing an
appendicolith. Appendix is otherwise normal without evidence of
appendicitis. Bladder wall is not thickened. No free or loculated
pelvic fluid collections. No pelvic gas collections. Visualized
bones appear intact.
IMPRESSION: Multiple metallic foreign bodies demonstrated in the abdomen and
pelvis as discussed. Razor blade and paper clip in the upper and mid
stomach. Additional paper clip in the duodenum, perforating into the
right kidney. Additional paper clip in the retroperitoneum
perforating the inferior vena cava and extending into the iliopsoas
muscle on the right. Additional paper clip in the sigmoid colon
perforating into the anterior left lower quadrant soft tissues.
Additional paper clip in the upper rectum perforating into the
bladder.

Findings were discussed with the on-call surgeon, Dr. Varanita, at

## 2017-11-30 ENCOUNTER — Ambulatory Visit: Payer: Self-pay | Admitting: Physician Assistant

## 2020-11-21 DEATH — deceased
